# Patient Record
Sex: Female | Born: 1995 | Race: Black or African American | Hispanic: No | Marital: Single | Smoking: Never smoker
Health system: Southern US, Community
[De-identification: ages and names within clinical notes are randomized; demographics above are authoritative.]

---
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
## 2015-12-20 ENCOUNTER — Other Ambulatory Visit: Payer: Self-pay | Admitting: Family

## 2015-12-20 DIAGNOSIS — N63 Unspecified lump in unspecified breast: Secondary | ICD-10-CM

## 2015-12-22 ENCOUNTER — Other Ambulatory Visit: Payer: Self-pay

## 2015-12-23 ENCOUNTER — Ambulatory Visit
Admission: RE | Admit: 2015-12-23 | Discharge: 2015-12-23 | Disposition: A | Discharge status: Home / Self Care | Payer: BLUE CROSS/BLUE SHIELD | Source: Ambulatory Visit | Attending: Family | Admitting: Family

## 2015-12-23 DIAGNOSIS — N63 Unspecified lump in unspecified breast: Secondary | ICD-10-CM

## 2017-03-29 IMAGING — US US BREAST*R* LIMITED INC AXILLA
1 series · 6 of 6 positions shown · non-contrast
Comparison: Previous exam(s).

CLINICAL DATA: Patient with palpable lump along the upper inner
right breast. Her family history of breast carcinoma with mother
diagnosed at age 31.

EXAM:
ULTRASOUND OF THE RIGHT BREAST

[Series 1: us breast*right* limited inc axilla · 0.06mm/px · 6 of 6 slices shown]
[im 1/6]
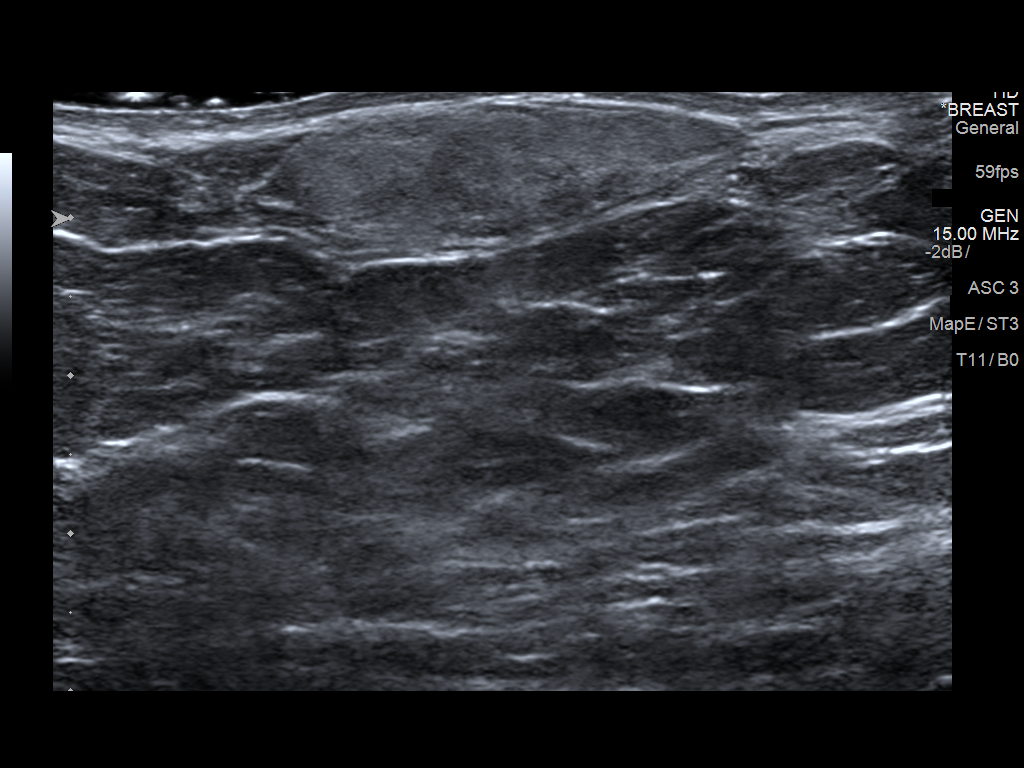
[im 2/6]
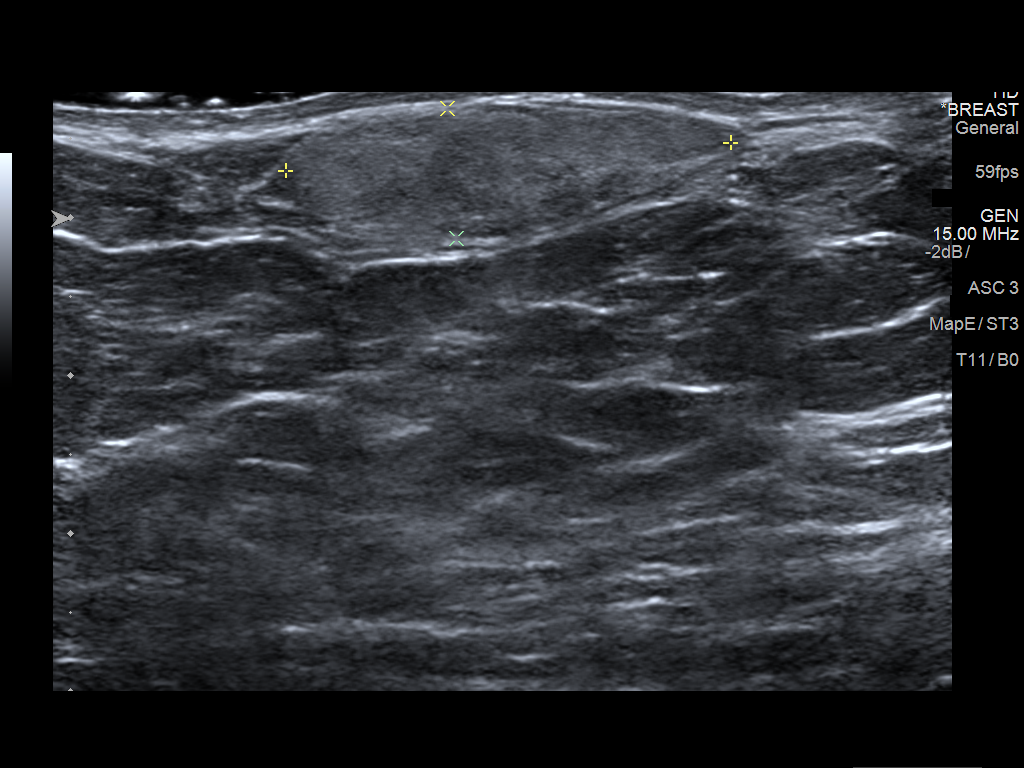
[im 3/6]
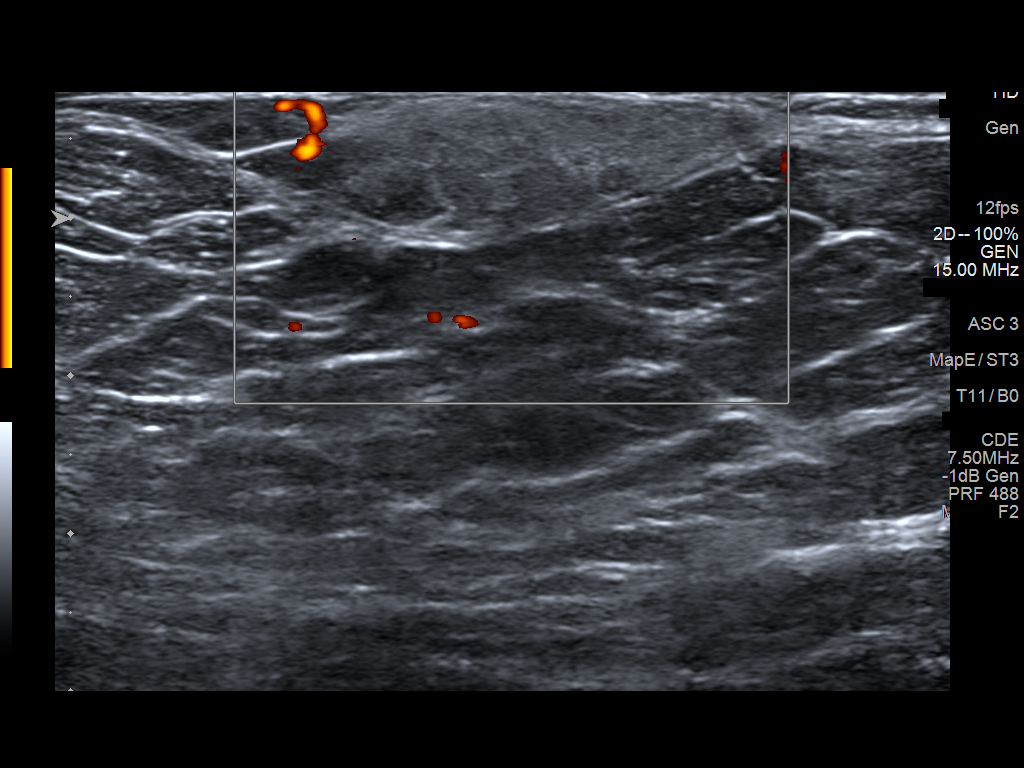
[im 4/6]
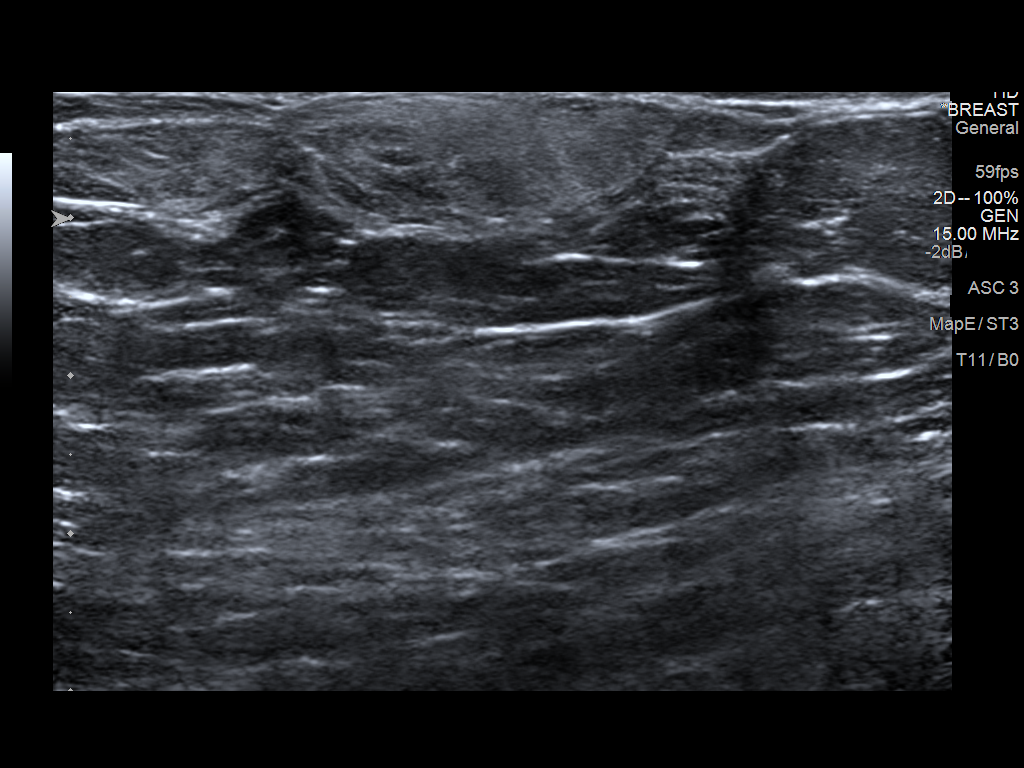
[im 5/6]
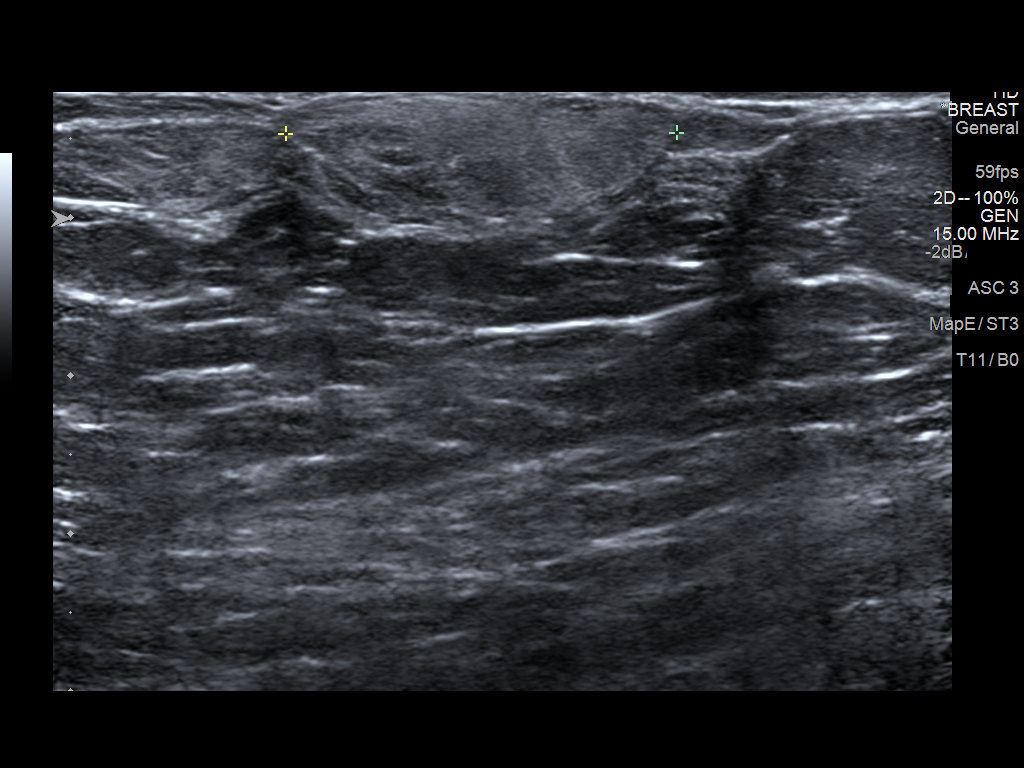
[im 6/6]
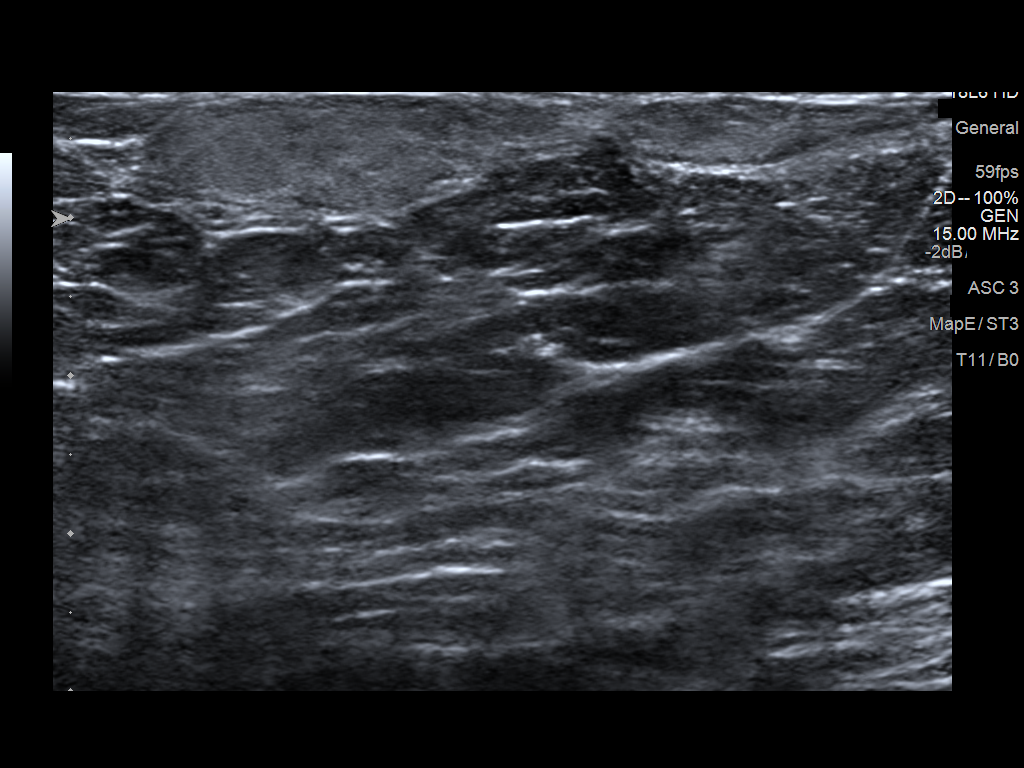

[6 of 6 positions shown; findings below may reference images not displayed]

FINDINGS: On physical exam, there is a superficial mobile smooth palpable
nodule in the upper inner right breast.

Targeted ultrasound is performed, showing an oval circumscribed
mildly heterogeneous and mildly echogenic mass in the right breast
at 12:30 o'clock, 4 cm the nipple on measuring 2.8 x 0.8 x 2.5 cm.
This lies just underneath the talus. There is a smaller
similar-appearing lesion just above this.
IMPRESSION: Probably benign superficial lesion is a hazy upper inner right
breast. These could reflect contusion/ areas of fat necrosis. They
may reflect small lipomas. Short-term follow-up is recommended.

RECOMMENDATION:
Repeat right breast ultrasound in 3 months.

I have discussed the findings and recommendations with the patient.
Results were also provided in writing at the conclusion of the
visit. If applicable, a reminder letter will be sent to the patient
regarding the next appointment.

BI-RADS CATEGORY  3: Probably benign finding(s) - short interval
follow-up suggested.
